# Patient Record
Sex: Female | Born: 1965 | Race: White | Hispanic: No | Marital: Married
Health system: Southern US, Community
[De-identification: ages and names within clinical notes are randomized; demographics above are authoritative.]

---

## 2011-10-30 ENCOUNTER — Emergency Department: Payer: Self-pay | Admitting: Emergency Medicine

## 2015-02-06 ENCOUNTER — Emergency Department: Payer: Self-pay | Admitting: Emergency Medicine

## 2022-02-08 ENCOUNTER — Emergency Department: Payer: Self-pay

## 2022-02-08 ENCOUNTER — Emergency Department
Admission: EM | Admit: 2022-02-08 | Discharge: 2022-02-08 | Disposition: A | Discharge status: Home / Self Care | Payer: Self-pay | Attending: Student in an Organized Health Care Education/Training Program | Admitting: Student in an Organized Health Care Education/Training Program

## 2022-02-08 ENCOUNTER — Other Ambulatory Visit: Payer: Self-pay

## 2022-02-08 DIAGNOSIS — Y92002 Bathroom of unspecified non-institutional (private) residence single-family (private) house as the place of occurrence of the external cause: Secondary | ICD-10-CM | POA: Insufficient documentation

## 2022-02-08 DIAGNOSIS — W182XXA Fall in (into) shower or empty bathtub, initial encounter: Secondary | ICD-10-CM | POA: Insufficient documentation

## 2022-02-08 DIAGNOSIS — S72421A Displaced fracture of lateral condyle of right femur, initial encounter for closed fracture: Secondary | ICD-10-CM | POA: Insufficient documentation

## 2022-02-08 MED ORDER — OXYCODONE-ACETAMINOPHEN 5-325 MG PO TABS
1.0000 | ORAL_TABLET | ORAL | 0 refills | Status: AC | PRN
Start: 2022-02-08 — End: 2022-02-13

## 2022-02-08 MED ORDER — MORPHINE SULFATE (PF) 4 MG/ML IV SOLN
4.0000 mg | Freq: Once | INTRAVENOUS | Status: AC
  Administered 2022-02-08: 4 mg via INTRAVENOUS
  Filled 2022-02-08: qty 1

## 2022-02-08 MED ORDER — OXYCODONE-ACETAMINOPHEN 5-325 MG PO TABS
1.0000 | ORAL_TABLET | Freq: Once | ORAL | Status: AC
  Administered 2022-02-08: 1 via ORAL
  Filled 2022-02-08: qty 1

## 2022-02-08 MED ORDER — ONDANSETRON HCL 4 MG/2ML IJ SOLN
4.0000 mg | Freq: Once | INTRAMUSCULAR | Status: AC
  Administered 2022-02-08: 4 mg via INTRAVENOUS
  Filled 2022-02-08: qty 2

## 2022-02-08 MED ORDER — HYDROMORPHONE HCL 1 MG/ML IJ SOLN
0.5000 mg | INTRAMUSCULAR | Status: DC | PRN
  Administered 2022-02-08 (×2): 0.5 mg via INTRAVENOUS
  Filled 2022-02-08 (×2): qty 1

## 2022-02-08 NOTE — ED Provider Notes (Signed)
[facility] Provider Note    Event Date/Time   First MD Initiated Contact with Patient 02/08/22 1557     (approximate)   History   Fall   HPI  Erika Moss is a 56 y.o. female no previous past medical history presents to the ER for acute right knee pain that occurred after the patient fell slipping out of the bathroom early this morning.  States that she was stepping out of the bathtub with a right foot the left foot gave way.  Did not hit her head.  Denies any abdominal pain no pelvic pain no hip pain no back pain.  Does describe moderate to severe right knee pain no numbness or tingling.     Physical Exam   Triage Vital Signs: ED Triage Vitals  Enc Vitals Group     BP 02/08/22 1342 (!) 115/57     Pulse Rate 02/08/22 1342 (!) 53     Resp 02/08/22 1342 16     Temp 02/08/22 1342 98.5 F (36.9 C)     Temp src --      SpO2 02/08/22 1342 98 %     Weight 02/08/22 1343 265 lb (120.2 kg)     Height 02/08/22 1343 5\' 3"  (1.6 m)     Head Circumference --      Peak Flow --      Pain Score 02/08/22 1340 7     Pain Loc --      Pain Edu? --      Excl. in GC? --     Most recent vital signs: Vitals:   02/08/22 1342  BP: (!) 115/57  Pulse: (!) 53  Resp: 16  Temp: 98.5 F (36.9 C)  SpO2: 98%     Constitutional: Alert  Eyes: Conjunctivae are normal.  Head: Atraumatic. Nose: No congestion/rhinnorhea. Mouth/Throat: Mucous membranes are moist.   Neck: Painless ROM.  Cardiovascular:   Good peripheral circulation. Respiratory: Normal respiratory effort.  No retractions.  Gastrointestinal: Soft and nontender.  Musculoskeletal:  no deformity, ttp of lateral knee, compartments are soft, n/v distally Neurologic:  MAE spontaneously. No gross focal neurologic deficits are appreciated.  Skin:  Skin is warm, dry and intact. No rash noted. Psychiatric: Mood and affect are normal. Speech and behavior are normal.    ED Results / Procedures / Treatments    Labs (all labs ordered are listed, but only abnormal results are displayed) Labs Reviewed - No data to display   EKG     RADIOLOGY Please see ED Course for my review and interpretation.  I personally reviewed all radiographic images ordered to evaluate for the above acute complaints and reviewed radiology reports and findings.  These findings were personally discussed with the patient.  Please see medical record for radiology report.    PROCEDURES:  Critical Care performed: No  Procedures   MEDICATIONS ORDERED IN ED: Medications  HYDROmorphone (DILAUDID) injection 0.5 mg (0.5 mg Intravenous Given 02/08/22 1854)  oxyCODONE-acetaminophen (PERCOCET/ROXICET) 5-325 MG per tablet 1 tablet (has no administration in time range)  morphine (PF) 4 MG/ML injection 4 mg (4 mg Intravenous Given 02/08/22 1506)  ondansetron (ZOFRAN) injection 4 mg (4 mg Intravenous Given 02/08/22 1506)  oxyCODONE-acetaminophen (PERCOCET/ROXICET) 5-325 MG per tablet 1 tablet (1 tablet Oral Given 02/08/22 1742)     IMPRESSION / MDM / ASSESSMENT AND PLAN / ED COURSE  I reviewed the triage vital signs and the nursing notes.  Differential diagnosis includes, but is not limited to, fracture contusion, dislocation, ligamentous injury, vascular injury  Patient presenting with acute right knee pain suspected fracture possible dislocation.  X-rays to be ordered for above differential will order IV morphine.   Clinical Course as of 02/08/22 1955  Mon Feb 08, 2022  1624 My review of the x-ray of the hip I do not appreciate any dislocation or fracture.  On my review of the knee x-ray no concern for distal femur fracture.  Patient having persistent pain will order IV Dilaudid. [PR]  1736 Case discussed in consultation with Dr. Martha Clan of orthopedics.  Is recommending knee immobilizer nonweightbearing and close outpatient follow-up.  Discussed this with the patient and family member at  bedside.  They are agreeable to plan. [PR]  1931 Patient unable to steadily ambulate with crutches.  She does have a ramp at her house and is wheelchair accessible.  Discussed option for hospitalization for pain control and PT OT evaluation.  Patient states that she does not want to stay here and would like to be discharged.  Will order wheelchair. [PR]  1951 X-ray of humerus by my review does not show any evidence of fracture. [PR]    Clinical Course User Index [PR] Willy Eddy, MD     FINAL CLINICAL IMPRESSION(S) / ED DIAGNOSES   Final diagnoses:  Closed displaced fracture of lateral condyle of right femur, initial encounter (HCC)     Rx / DC Orders   ED Discharge Orders          Ordered    oxyCODONE-acetaminophen (PERCOCET) 5-325 MG tablet  Every 4 hours PRN        02/08/22 1955             Note:  This document was prepared using Dragon voice recognition software and may include unintentional dictation errors.    Willy Eddy, MD 02/08/22 Corky Crafts

## 2022-02-08 NOTE — Consult Note (Signed)
ORTHOPAEDIC CONSULTATION  REQUESTING PHYSICIAN: Erika Eddy, MD  Chief Complaint: Left hip and knee pain s/p fall getting out of shower.  HPI: Called by Dr. Willy Moss regarding Erika Moss is a 56 y.o. female who complains of left hip and knee pain s/p fall getting out of shower.  Review of a CT scan of the left knee confirmed an impacted fracture of the lateral femoral condyle with minimal displacement and less than 3 mm of step-off.  Left hip xrays are negative for fracture or dislocation.    No past medical history on file.  Social History   Socioeconomic History   Marital status: Married    Spouse name: Not on file   Number of children: Not on file   Years of education: Not on file   Highest education level: Not on file  Occupational History   Not on file  Tobacco Use   Smoking status: Not on file   Smokeless tobacco: Not on file  Substance and Sexual Activity   Alcohol use: Not on file   Drug use: Not on file   Sexual activity: Not on file  Other Topics Concern   Not on file  Social History Narrative   Not on file   Social Determinants of Health   Financial Resource Strain: Not on file  Food Insecurity: Not on file  Transportation Needs: Not on file  Physical Activity: Not on file  Stress: Not on file  Social Connections: Not on file   No family history on file. No Known Allergies Prior to Admission medications   Not on File   CT Knee Right Wo Contrast  Result Date: 02/08/2022 CLINICAL DATA:  Knee trauma, internal derangement suspected, xray done Knee trauma, occult fracture suspected, xray done EXAM: CT OF THE RIGHT KNEE WITHOUT CONTRAST TECHNIQUE: Multidetector CT imaging of the right knee was performed according to the standard protocol. Multiplanar CT image reconstructions were also generated. RADIATION DOSE REDUCTION: This exam was performed according to the departmental dose-optimization program which includes automated exposure control,  adjustment of the mA and/or kV according to patient size and/or use of iterative reconstruction technique. COMPARISON:  Radiograph 02/08/2022 FINDINGS: Bones/Joint/Cartilage There is a mildly displaced and comminuted fracture of the lateral femoral condyle extending through the intercondylar notch and along the mid to anterior articular surface. There is a moderate-sized lipohemarthrosis. There is no evidence of proximal tibia or fibula fracture. No evidence of a patellar fracture. 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 Ligaments Suboptimally assessed by CT. Muscles and Tendons No intramuscular collection. No acute myotendinous abnormality on CT. Soft tissues Mild superficial soft tissue swelling.  No focal fluid collection. IMPRESSION: Mild displaced and comminuted intra-articular fracture of the lateral femoral condyle extending through the intercondylar notch and mid to anterior articular surface. Moderate-sized lipohemarthrosis. 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 Electronically Signed   By: Caprice Renshaw M.D.   On: 02/08/2022 16:57   DG Knee Complete 4 Views Right  Result Date: 02/08/2022 CLINICAL DATA:  RIGHT knee pain after falling in shower today EXAM: RIGHT KNEE - COMPLETE 4+ VIEW COMPARISON:  None FINDINGS: Osseous demineralization. Joint space narrowing patellofemoral joint and lateral compartment. Moderate-sized joint effusion. Deformity of the lateral margin of the lateral femoral condyle suspicious for fracture, less likely due to spur formation. No additional fracture, dislocation, or bone destruction. IMPRESSION: Deformity at RIGHT lateral femoral condyle laterally, suspicious for fracture. Associated RIGHT knee joint effusion. Further assessment by CT recommended. Electronically  Signed   By: Ulyses Southward M.D.   On: 02/08/2022 16:09   DG Hip Unilat W or Wo Pelvis 2-3 Views Right  Result Date: 02/08/2022 CLINICAL DATA:   Status post fall with right lower extremity pain. EXAM: DG HIP (WITH OR WITHOUT PELVIS) 2-3V RIGHT COMPARISON:  None. FINDINGS: There is no evidence of hip fracture or dislocation. IMPRESSION: No acute fracture or dislocation of right hip. Electronically Signed   By: Sherian Rein M.D.   On: 02/08/2022 16:00    Assessment: Left lateral femoral condyle fracture  Plan: Patient's fracture is impacted without significant displacement or angulation.   There is less than 3 mm of articular step-off and therefore I do not feel she requires surgery at this time.  I am recommending the patient be placed in a knee immobilizer and given crutches.  She must remain NWB on the left lower extremity. She will follow up with me in 1 week in the clinic for evaluation and repeat xrays. If there is displacement of the fracture at her follow up she may require surgical intervention at that time.     Erika Fairly, MD    02/08/2022 5:16 PM

## 2022-02-08 NOTE — ED Provider Triage Note (Signed)
Emergency Medicine Provider Triage Evaluation Note  Erika Moss , a 56 y.o. female  was evaluated in triage.  Pt complains of pain to the right hip and right knee.  Patient fell out of the tub this morning.  No head injury..  Review of Systems  Positive: Right hip and knee pain Negative: Head injury, neck pain  Physical Exam  BP (!) 115/57    Pulse (!) 53    Temp 98.5 F (36.9 C)    Resp 16    Ht 5\' 3"  (1.6 m)    Wt 120.2 kg    SpO2 98%    BMI 46.94 kg/m  Gen:   Awake, no distress   Resp:  Normal effort  MSK:   Unable to extend the right knee, tender at the right hip, tender at the right knee Other:    Medical Decision Making  Medically screening exam initiated at 2:35 PM.  Appropriate orders placed.  Erika Moss was informed that the remainder of the evaluation will be completed by another provider, this initial triage assessment does not replace that evaluation, and the importance of remaining in the ED until their evaluation is complete.  Imaging and pain meds ordered   Erika Starks, PA-C 02/08/22 1436

## 2022-02-08 NOTE — ED Notes (Signed)
FIRST NURSE NOTE; Pt via POV from home. Pt slipped out of the shower and fell. Denies LOC. Denies headache. Pt c/o R knee pain that radiates to her hip. No deformity noted. Pt is A&Ox4 and NAD.   20G R AC 39mcg of Fentanyl by EMS  57 HR  135 CBG  129/47

## 2022-02-08 NOTE — Discharge Instructions (Signed)
Be sure to take a laxative with the pain medication to prevent constipation.  Take a baby aspirin daily to reduce risk of blood clots.  Do not put any weight on the right leg.

## 2022-07-21 IMAGING — CT CT KNEE*R* W/O CM
4 of 6 series · 15 of 33 positions shown, 17 images · non-contrast
Comparison: Radiograph 02/08/2022

CLINICAL DATA: Knee trauma, internal derangement suspected, xray
done Knee trauma, occult fracture suspected, xray done

EXAM:
CT OF THE RIGHT KNEE WITHOUT CONTRAST
TECHNIQUE: Multidetector CT imaging of the right knee was performed according
to the standard protocol. Multiplanar CT image reconstructions were
also generated.
RADIATION DOSE REDUCTION: This exam was performed according to the
departmental dose-optimization program which includes automated
exposure control, adjustment of the mA and/or kV according to
patient size and/or use of iterative reconstruction technique.

[Series 5: axial bone · axial · 0.34mm/px · z∈[-1420,-1272]mm · 5 of 226 slices shown, 7 images]
[im 38/226  soft-tissue]
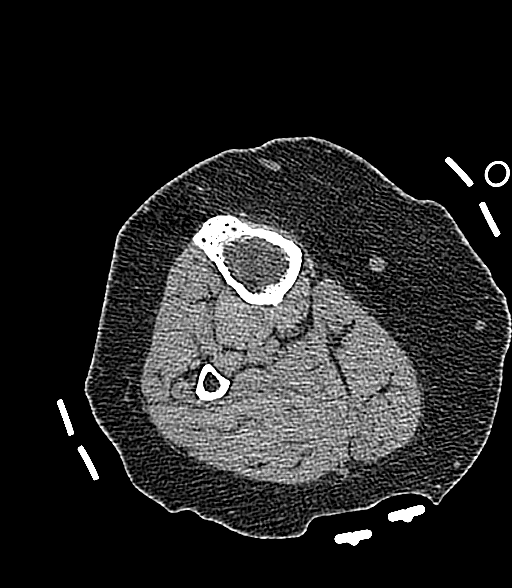
[im 38/226  bone]
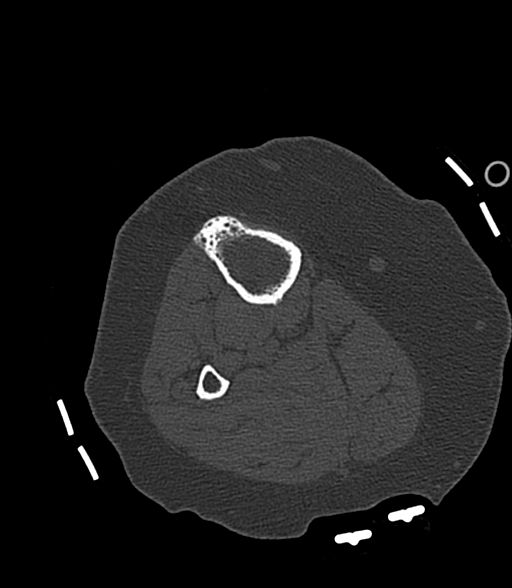
[im 76/226  bone]
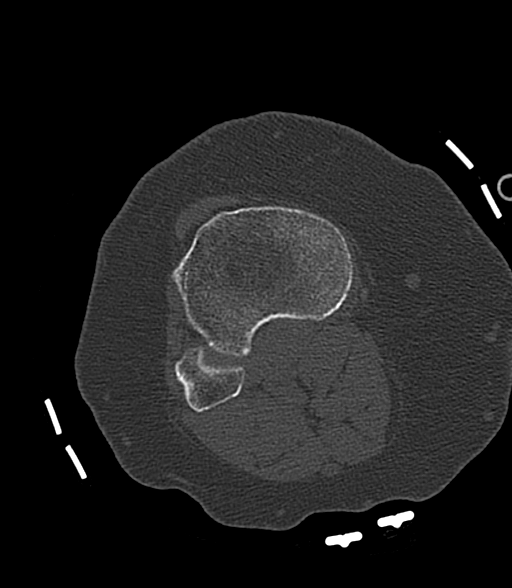
[im 113/226  bone]
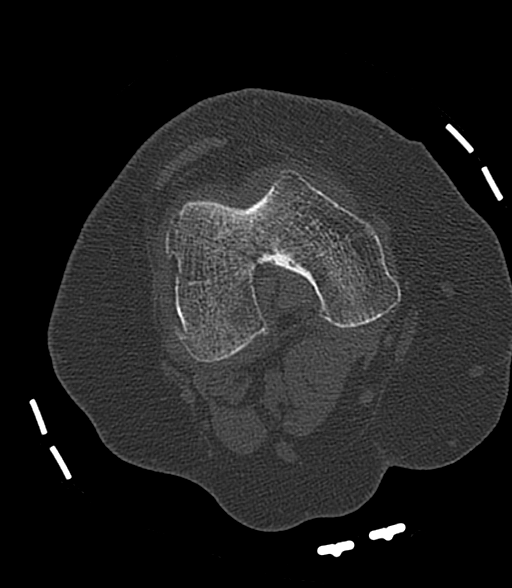
[im 151/226  bone]
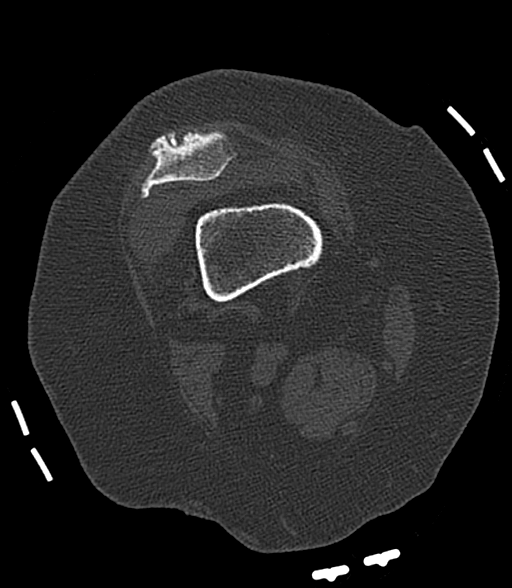
[im 188/226  soft-tissue]
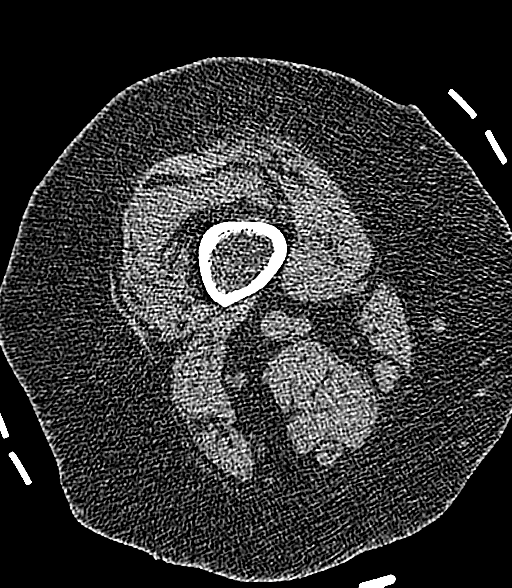
[im 188/226  bone]
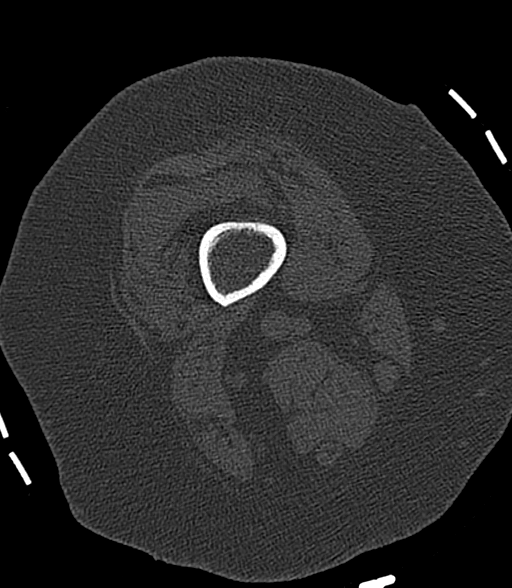

[Series 6: cor bone · coronal · 0.31mm/px · 1 of 201 slices shown]
[im 101/201  bone]
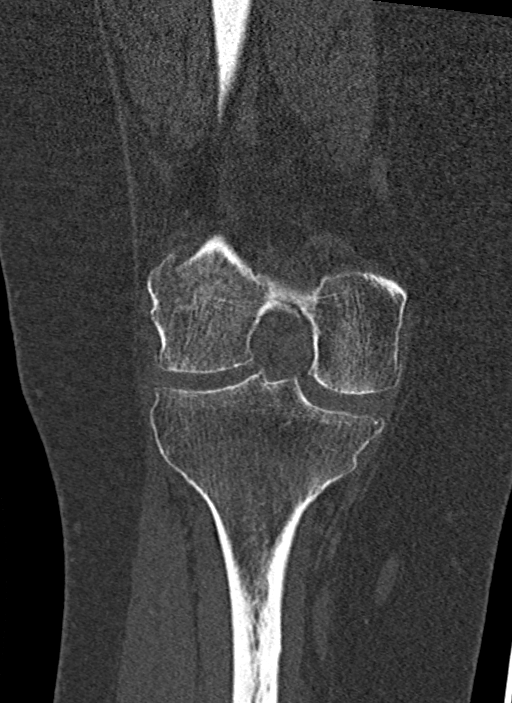

[Series 8: axial st · axial · 0.37mm/px · z∈[-1421,-1308]mm · 4 of 226 slices shown]
[im 38/226  bone]
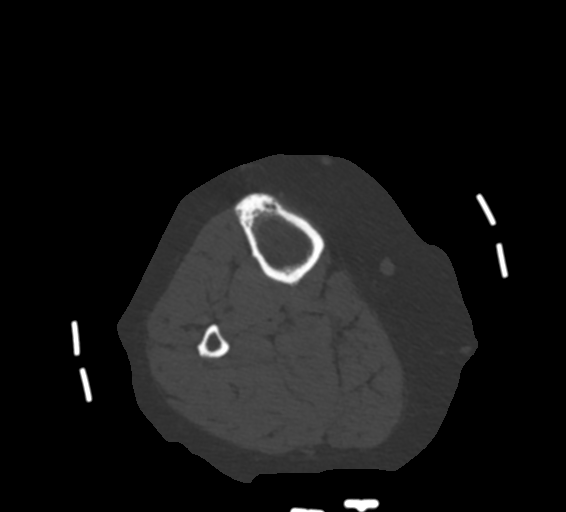
[im 76/226  bone]
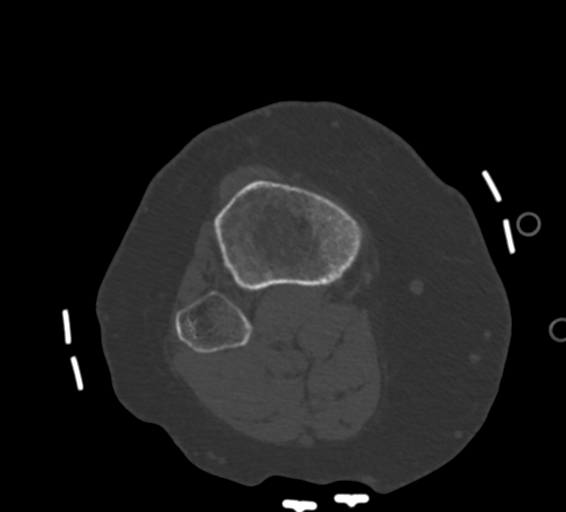
[im 113/226  bone]
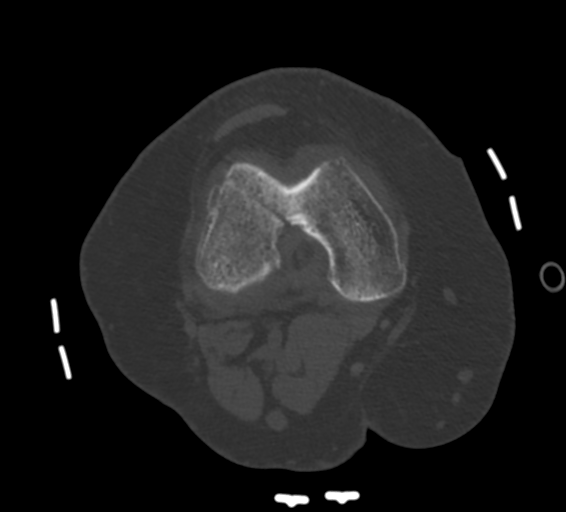
[im 151/226  bone]
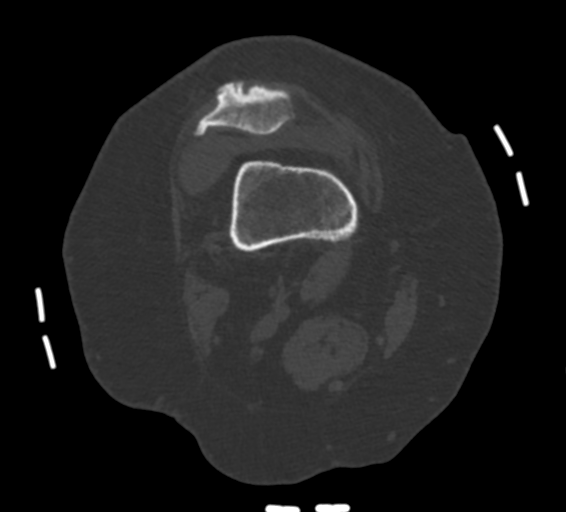

[Series 10: sag st · sagittal · 0.39mm/px · 5 of 229 slices shown]
[im 39/229  bone]
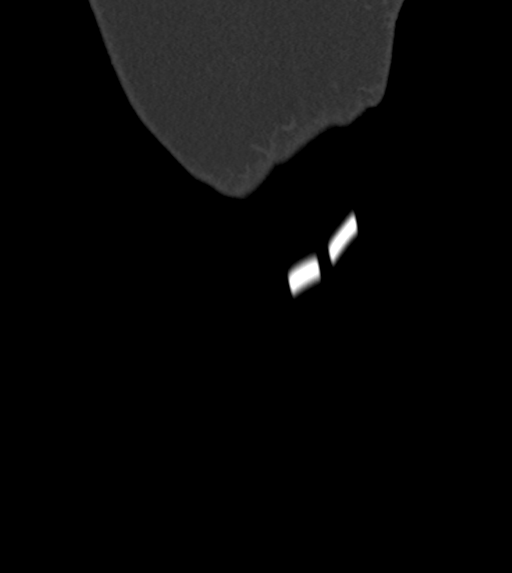
[im 77/229  bone]
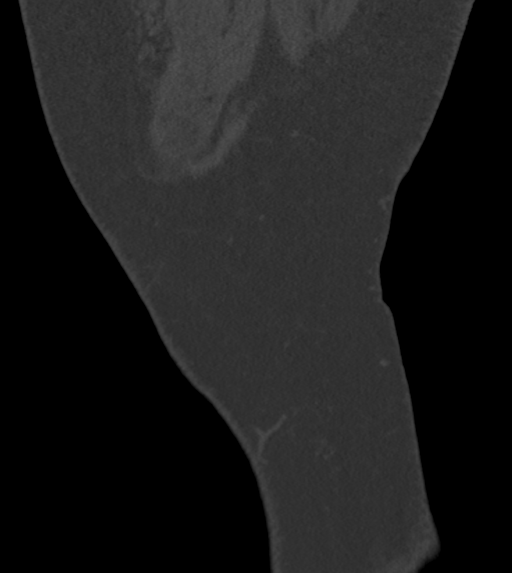
[im 115/229  bone]
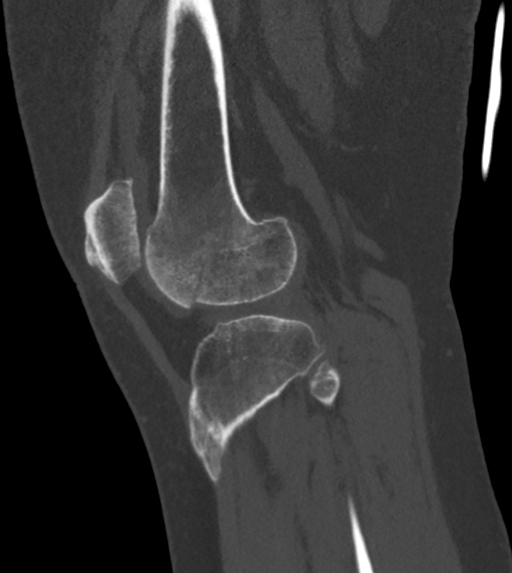
[im 153/229  bone]
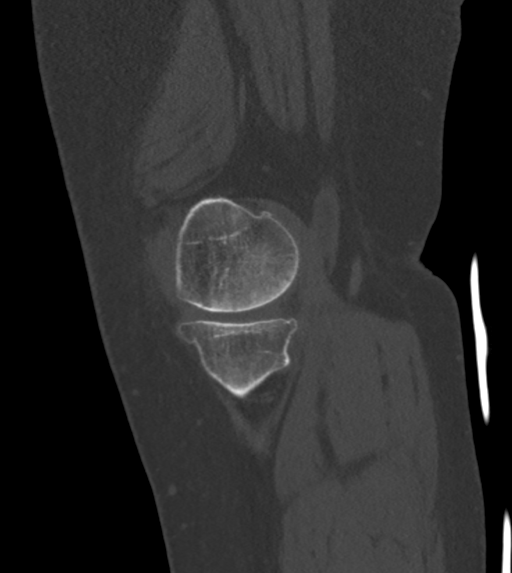
[im 191/229  bone]
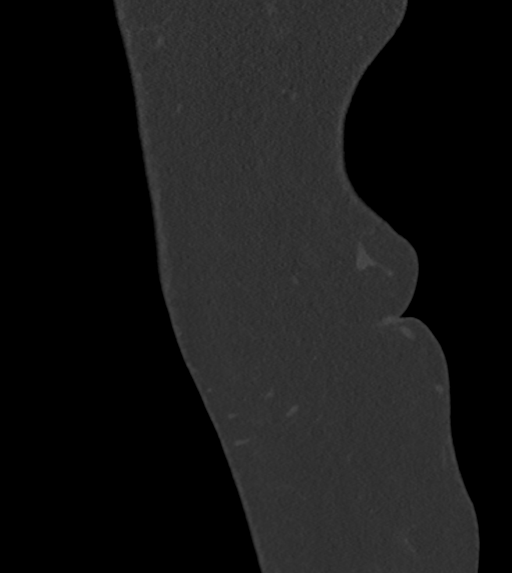

[15 of 33 positions shown; findings below may reference images not displayed]

FINDINGS: Bones/Joint/Cartilage

There is a mildly displaced and comminuted fracture of the lateral
femoral condyle extending through the intercondylar notch and along
the mid to anterior articular surface. There is a moderate-sized
lipohemarthrosis. There is no evidence of proximal tibia or fibula
fracture. No evidence of a patellar fracture. There is
tricompartment osteoarthritis, worst in the patellofemoral
compartment.

Ligaments

Suboptimally assessed by CT.

Muscles and Tendons

No intramuscular collection. No acute myotendinous abnormality on
CT.

Soft tissues

Mild superficial soft tissue swelling.  No focal fluid collection.
IMPRESSION: Mild displaced and comminuted intra-articular fracture of the
lateral femoral condyle extending through the intercondylar notch
and mid to anterior articular surface. Moderate-sized
lipohemarthrosis.

Tricompartment osteoarthritis worst in the patellofemoral
compartment.

## 2023-05-14 ENCOUNTER — Emergency Department: Payer: Medicaid Other

## 2023-05-14 ENCOUNTER — Emergency Department
Admission: EM | Admit: 2023-05-14 | Discharge: 2023-05-15 | Disposition: A | Discharge status: Home / Self Care | Payer: Medicaid Other | Attending: Emergency Medicine | Admitting: Emergency Medicine

## 2023-05-14 ENCOUNTER — Other Ambulatory Visit: Payer: Self-pay

## 2023-05-14 DIAGNOSIS — S42291A Other displaced fracture of upper end of right humerus, initial encounter for closed fracture: Secondary | ICD-10-CM | POA: Diagnosis not present

## 2023-05-14 DIAGNOSIS — W010XXA Fall on same level from slipping, tripping and stumbling without subsequent striking against object, initial encounter: Secondary | ICD-10-CM | POA: Insufficient documentation

## 2023-05-14 DIAGNOSIS — Y92009 Unspecified place in unspecified non-institutional (private) residence as the place of occurrence of the external cause: Secondary | ICD-10-CM | POA: Diagnosis not present

## 2023-05-14 DIAGNOSIS — Y9301 Activity, walking, marching and hiking: Secondary | ICD-10-CM | POA: Insufficient documentation

## 2023-05-14 DIAGNOSIS — M25511 Pain in right shoulder: Secondary | ICD-10-CM | POA: Diagnosis present

## 2023-05-14 MED ORDER — OXYCODONE-ACETAMINOPHEN 5-325 MG PO TABS
2.0000 | ORAL_TABLET | Freq: Once | ORAL | Status: AC
  Administered 2023-05-14: 2 via ORAL
  Filled 2023-05-14: qty 2

## 2023-05-14 MED ORDER — ONDANSETRON HCL 4 MG/2ML IJ SOLN
4.0000 mg | INTRAMUSCULAR | Status: AC
  Administered 2023-05-14: 4 mg via INTRAVENOUS
  Filled 2023-05-14: qty 2

## 2023-05-14 MED ORDER — ONDANSETRON 4 MG PO TBDP: ORAL_TABLET | ORAL | 0 refills | Status: AC

## 2023-05-14 MED ORDER — HYDROMORPHONE HCL 1 MG/ML IJ SOLN
1.0000 mg | INTRAMUSCULAR | Status: AC
  Administered 2023-05-14: 1 mg via INTRAVENOUS
  Filled 2023-05-14: qty 1

## 2023-05-14 MED ORDER — OXYCODONE HCL 5 MG PO TABS: 10.0000 mg | ORAL_TABLET | Freq: Four times a day (QID) | ORAL | 0 refills | Status: AC | PRN

## 2023-05-14 NOTE — ED Triage Notes (Signed)
Pt to ED from home for fall. She tripped and landed on her right arm and felt a crack. Pt denies hitting her head and denies LOC. Pt is CAOx4, in mild discomfort.

## 2023-05-14 NOTE — Discharge Instructions (Addendum)
As we discussed, you have a fracture to the proximal (upper) part of your right humerus.  You do not need surgery at least at the moment; typically these injuries heal on their own with time.  Please use the provided sling/immobilizer and use it as much as possible to keep the shoulder from moving around.  Call on the next business day to the office of Dr. Joice Lofts to schedule a follow-up appointment with him or one of his colleagues in the orthopedics clinic.  This appointment will likely occur within the next week.  You can take over-the-counter ibuprofen and/or Tylenol for pain.  We recommend trying 600 mg of ibuprofen 3 times a day with meals and taking 2 extra strength Tylenol (1000 mg) no more often than every 6 hours.  Take Percocet as prescribed for severe pain. Do not drink alcohol, drive or participate in any other potentially dangerous activities while taking this medication as it may make you sleepy. Do not take this medication with any other sedating medications, either prescription or over-the-counter. If you were prescribed Percocet or Vicodin, do not take these with acetaminophen (Tylenol) as it is already contained within these medications.   This medication is an opiate (or narcotic) pain medication and can be habit forming.  Use it as little as possible to achieve adequate pain control.  Do not use or use it with extreme caution if you have a history of opiate abuse or dependence.  If you are on a pain contract with your primary care doctor or a pain specialist, be sure to let them know you were prescribed this medication today from the [facility] Emergency Department.  This medication is intended for your use only - do not give any to anyone else and keep it in a secure place where nobody else, especially children, have access to it.  It will also cause or worsen constipation, so you may want to consider taking an over-the-counter stool softener while you are taking this medication.     Return to the emergency department if you develop new or worsening symptoms that concern you.

## 2023-05-14 NOTE — ED Provider Notes (Signed)
[facility] Provider Note    Event Date/Time   First MD Initiated Contact with Patient 05/14/23 2251     (approximate)   History   Fall and Shoulder Injury (RIGHT)   HPI Erika Moss is a 57 y.o. female with no contributory past medical history other than an orthopedic injury to her right femur last year.  She presents with severe pain in her right shoulder after a fall at home.  She said that she was walking and got the leg of her right pants caught on a nail which caused her to trip and fall forward.  She tried to catch herself with her outstretched arms but absorbed the impact with her right shoulder.  She says she felt something snap and had immediate pain and has been unable to move her shoulder due to the severe pain.  She says she has a little bit of numbness or tingling in her hand but it is difficult to appreciate anything due to the amount of pain in her shoulder.  Any amount of movement makes it much worse.  She did not strike her head, did not lose consciousness, and has no pain in her head, neck, chest, nor abdomen.  She did not injure herself on her right arm.  Her legs also did not sustain an injury.  She is right-hand dominant.     Physical Exam   Triage Vital Signs: ED Triage Vitals  Enc Vitals Group     BP 05/14/23 2209 120/88     Pulse Rate 05/14/23 2209 75     Resp 05/14/23 2209 16     Temp 05/14/23 2209 98 F (36.7 C)     Temp Source 05/14/23 2209 Oral     SpO2 05/14/23 2209 98 %     Weight 05/14/23 2207 122.5 kg (270 lb)     Height 05/14/23 2207 1.6 m (5\' 3" )     Head Circumference --      Peak Flow --      Pain Score 05/14/23 2206 10     Pain Loc --      Pain Edu? --      Excl. in GC? --     Most recent vital signs: Vitals:   05/14/23 2209  BP: 120/88  Pulse: 75  Resp: 16  Temp: 98 F (36.7 C)  SpO2: 98%    General: Awake, alert, but severe distress due to right shoulder pain. CV:  Good peripheral perfusion.   Resp:  Normal effort. Speaking easily and comfortably, no accessory muscle usage nor intercostal retractions.   Abd:  No distention.  Morbid obesity. Other:  Patient is favoring her right shoulder which she is protecting and not moving.  She is able to very gently and with assistance flex her right elbow but any amount of movement causes severe pain in the right shoulder.  There is no obvious injury such as a gross deformity or substantial ecchymosis or swelling.  Her humerus, elbow, forearm, wrist, and hand all appear normal and are not specifically tender to palpation as long as the shoulder itself is not manipulated.  She has an easily palpable right radial pulse and is able to grip my fingers very strongly with her right hand.  No obvious injury to any other extremity.   ED Results / Procedures / Treatments   Labs (all labs ordered are listed, but only abnormal results are displayed) Labs Reviewed - No data to display   RADIOLOGY  I viewed and interpreted the patient's right humerus x-rays.  Patient has an obviously comminuted and impacted right humerus fracture.  No other obvious injury.  Radiology report agrees with this evaluation.   PROCEDURES:  Critical Care performed: No  Procedures    IMPRESSION / MDM / ASSESSMENT AND PLAN / ED COURSE  I reviewed the triage vital signs and the nursing notes.                              Differential diagnosis includes, but is not limited to, humerus fracture, shoulder dislocation, neurovascular compromise, clavicular injury, AC joint separation, elbow or forearm/wrist injury.  Patient's presentation is most consistent with acute presentation with potential threat to life or bodily function.  Labs/studies ordered: Right humerus x-rays  Interventions/Medications given:  Medications  ibuprofen (ADVIL) tablet 600 mg (has no administration in time range)  HYDROmorphone (DILAUDID) injection 1 mg (1 mg Intravenous Given 05/14/23 2303)   ondansetron (ZOFRAN) injection 4 mg (4 mg Intravenous Given 05/14/23 2303)  oxyCODONE-acetaminophen (PERCOCET/ROXICET) 5-325 MG per tablet 2 tablet (2 tablets Oral Given 05/14/23 2339)    (Note:  hospital course my include additional interventions and/or labs/studies not listed above.)   Right proximal humerus fracture obvious on x-rays as documented above.  Consulted with Dr. Joice Lofts with orthopedics, he reviewed the radiographs, recommended immobilization with sling/immobilizer.  Updated the patient and her husband.  Dilaudid 1 mg IV for immediate pain relief in the emergency department, Zofran 4 mg IV to try and prevent nausea and vomiting after analgesia administration.     Clinical Course as of 05/15/23 0011  Sat May 14, 2023  2325 I reviewed the [place] controlled substance database and verified that the patient does not have concerning prescribing patterns and I am writing her prescription for oxycodone to supplement her use of OTC acetaminophen and ibuprofen to treat her acute pain. [CF]    Clinical Course User Index [CF] Loleta Rose, MD     FINAL CLINICAL IMPRESSION(S) / ED DIAGNOSES   Final diagnoses:  Other closed displaced fracture of proximal end of right humerus, initial encounter     Rx / DC Orders   ED Discharge Orders          Ordered    oxyCODONE (ROXICODONE) 5 MG immediate release tablet  Every 6 hours PRN        05/14/23 2340    ondansetron (ZOFRAN-ODT) 4 MG disintegrating tablet        05/14/23 2340             Note:  This document was prepared using Dragon voice recognition software and may include unintentional dictation errors.   Loleta Rose, MD 05/14/23 Erika Moss    Loleta Rose, MD 05/15/23 [phone]

## 2023-05-15 MED ORDER — IBUPROFEN 600 MG PO TABS
600.0000 mg | ORAL_TABLET | Freq: Once | ORAL | Status: AC
  Administered 2023-05-15: 600 mg via ORAL
  Filled 2023-05-15: qty 1
